# Patient Record
Sex: Male | Born: 1950 | Race: White | Hispanic: No | Marital: Married
Health system: Southern US, Community
[De-identification: ages and names within clinical notes are randomized; demographics above are authoritative.]

---

## 2008-02-28 ENCOUNTER — Ambulatory Visit: Payer: Self-pay | Admitting: Unknown Physician Specialty

## 2009-04-09 ENCOUNTER — Ambulatory Visit: Payer: Self-pay

## 2012-07-16 ENCOUNTER — Emergency Department: Payer: Self-pay | Admitting: Emergency Medicine

## 2012-07-16 LAB — URINALYSIS, COMPLETE
Leukocyte Esterase: NEGATIVE
Nitrite: NEGATIVE
Ph: 5 (ref 4.5–8.0)
Protein: NEGATIVE
Squamous Epithelial: <1
WBC UR: 2 /HPF (ref 0–5)

## 2012-07-16 LAB — CBC
HCT: 46.2 % (ref 40.0–52.0)
HGB: 16.1 g/dL (ref 13.0–18.0)
MCH: 30.8 pg (ref 26.0–34.0)
MCHC: 34.9 g/dL (ref 32.0–36.0)
MCV: 88 fL (ref 80–100)
RBC: 5.23 10*6/uL (ref 4.40–5.90)

## 2012-07-16 LAB — COMPREHENSIVE METABOLIC PANEL
Alkaline Phosphatase: 90 U/L (ref 50–136)
BUN: 25 mg/dL — ABNORMAL HIGH (ref 7–18)
Bilirubin,Total: 0.9 mg/dL (ref 0.2–1.0)
Chloride: 106 mmol/L (ref 98–107)
Co2: 26 mmol/L (ref 21–32)
Creatinine: 1.97 mg/dL — ABNORMAL HIGH (ref 0.60–1.30)
EGFR (African American): 42 — ABNORMAL LOW
Osmolality: 284 (ref 275–301)
Potassium: 3.8 mmol/L (ref 3.5–5.1)
SGPT (ALT): 45 U/L (ref 12–78)
Sodium: 139 mmol/L (ref 136–145)
Total Protein: 7.5 g/dL (ref 6.4–8.2)

## 2013-06-29 IMAGING — CT CT STONE STUDY
1 of 2 series · 15 of 32 positions shown, 19 images · non-contrast
Comparison: None

REASON FOR EXAM: left flank pain
COMMENTS:

PROCEDURE:     CT  - CT ABDOMEN /PELVIS WO (STONE)  - July 16, 2012  [DATE]
RESULT:     Indication: Flank Pain
TECHNIQUE: Multiple axial images from the lung bases to the symphysis pubis
were obtained without oral and without intravenous contrast.

[Series 2: 3mm soft tissue · axial · 0.86mm/px · z∈[-1002,-540]mm · 15 of 169 slices shown, 19 images]
[im 8/169  soft-tissue]
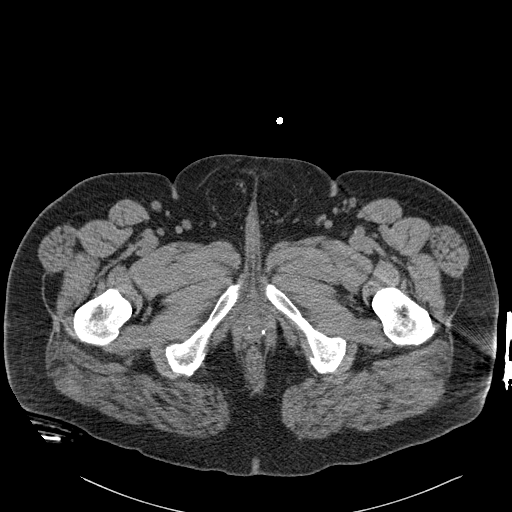
[im 8/169  bone]
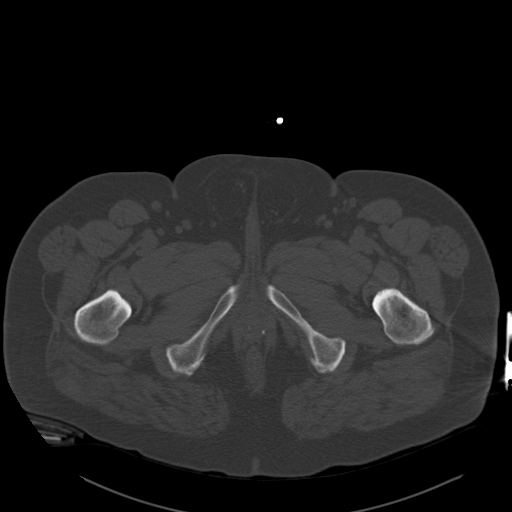
[im 22/169  soft-tissue]
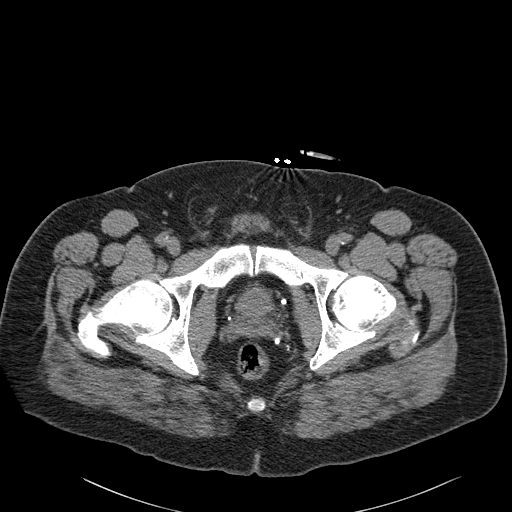
[im 36/169  soft-tissue]
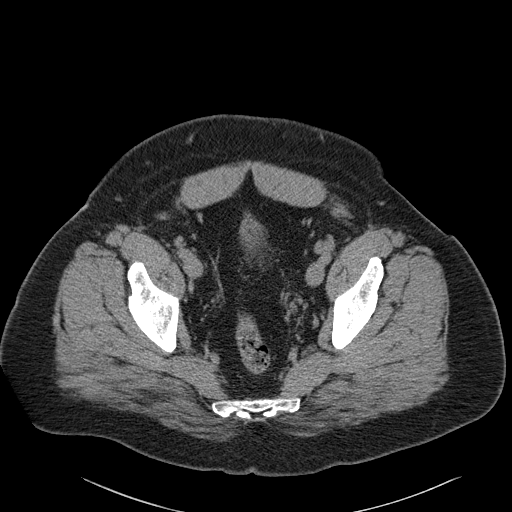
[im 50/169  soft-tissue]
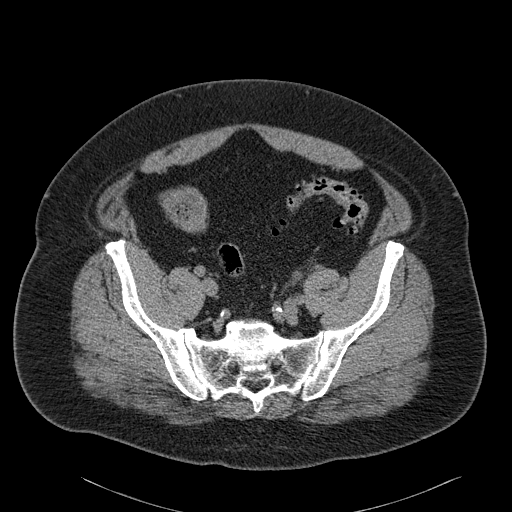
[im 57/169  soft-tissue]
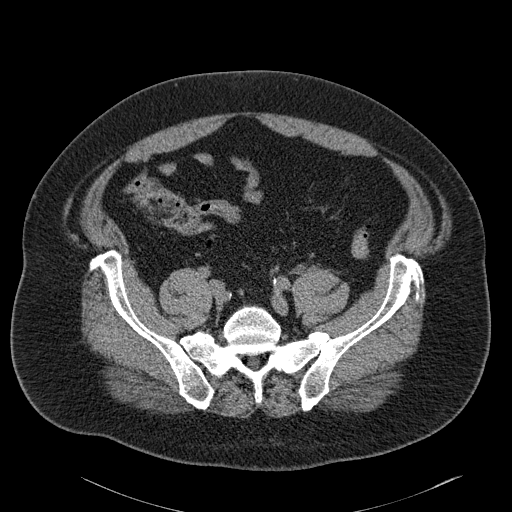
[im 71/169  soft-tissue]
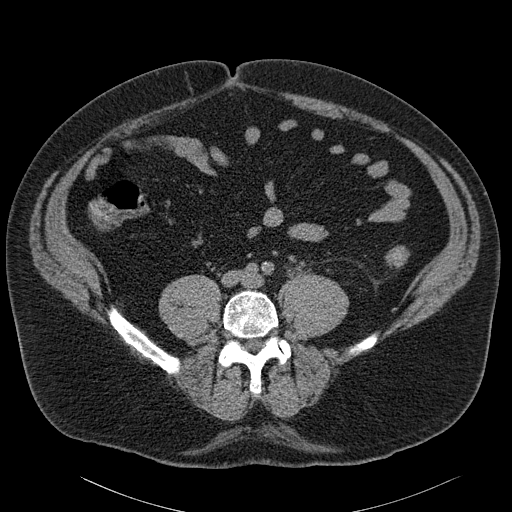
[im 85/169  soft-tissue]
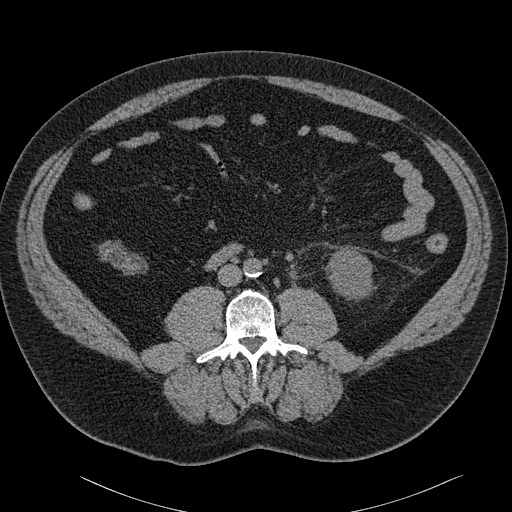
[im 99/169  soft-tissue]
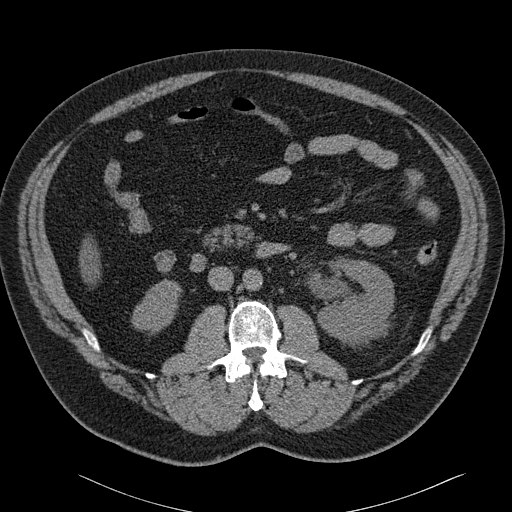
[im 113/169  soft-tissue]
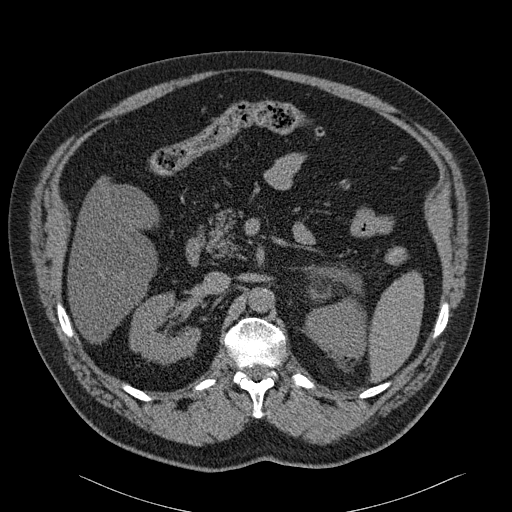
[im 113/169  bone]
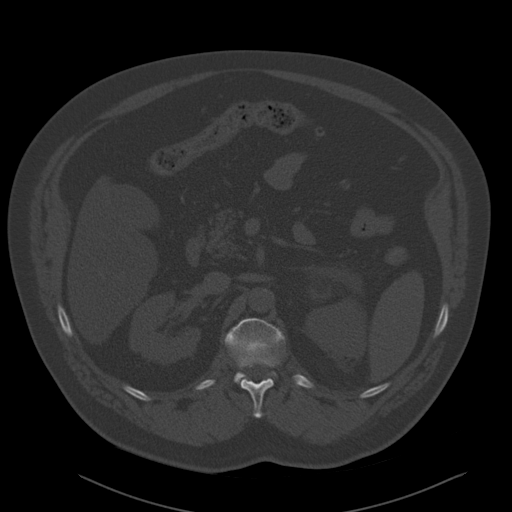
[im 120/169  soft-tissue]
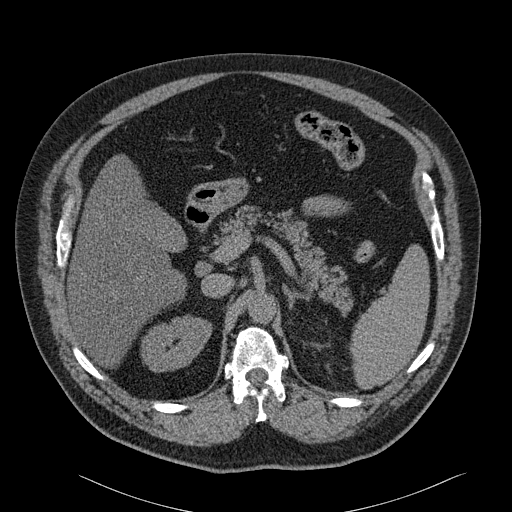
[im 134/169  soft-tissue]
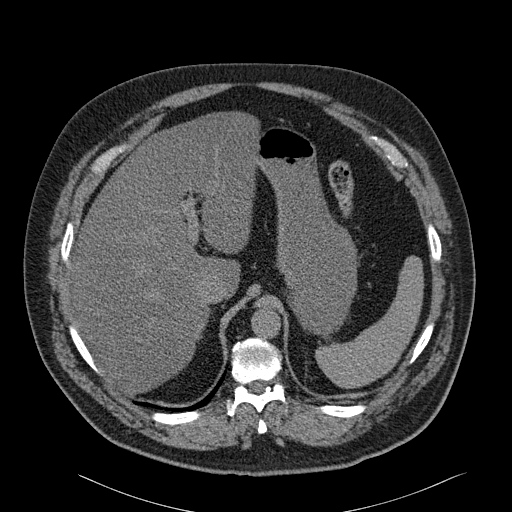
[im 141/169  lung]
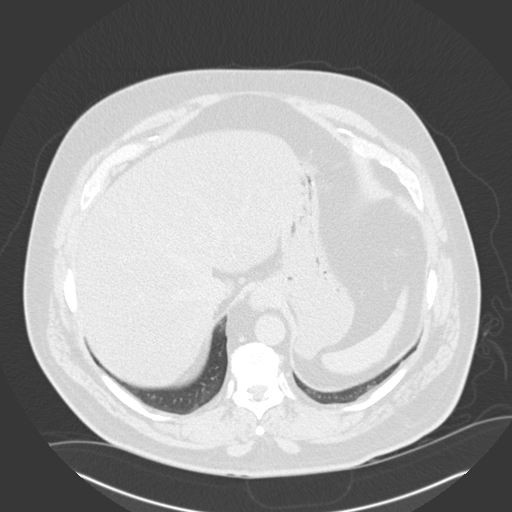
[im 148/169  soft-tissue]
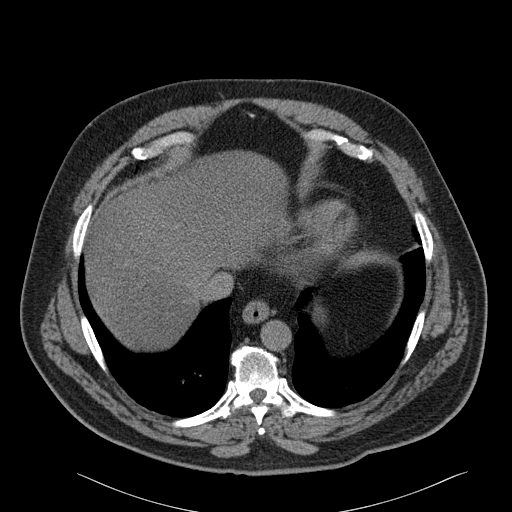
[im 148/169  lung]
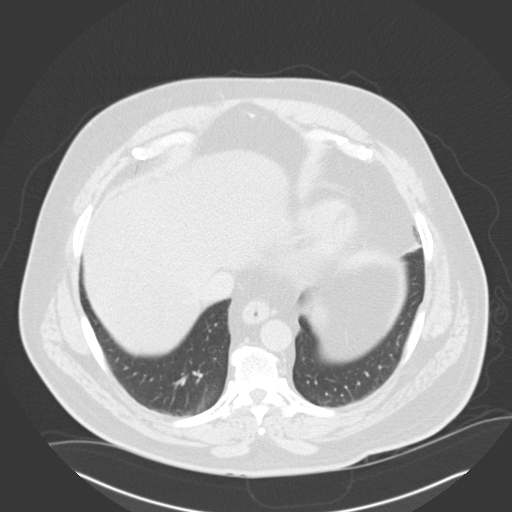
[im 155/169  lung]
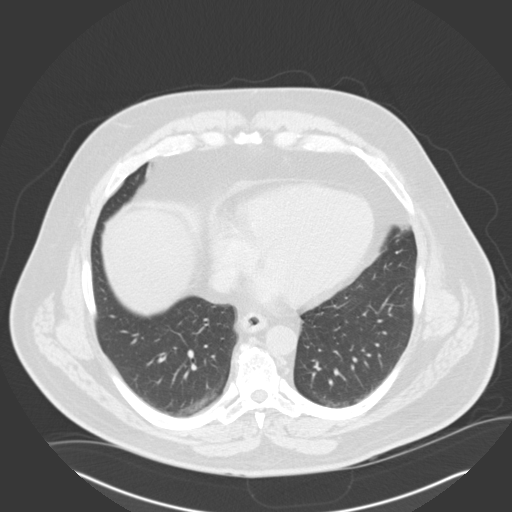
[im 162/169  soft-tissue]
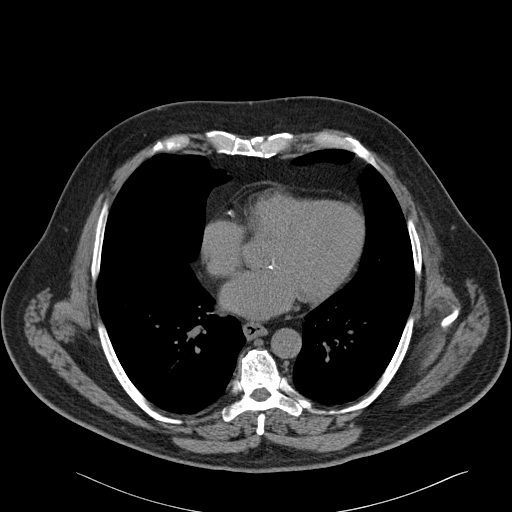
[im 162/169  lung]
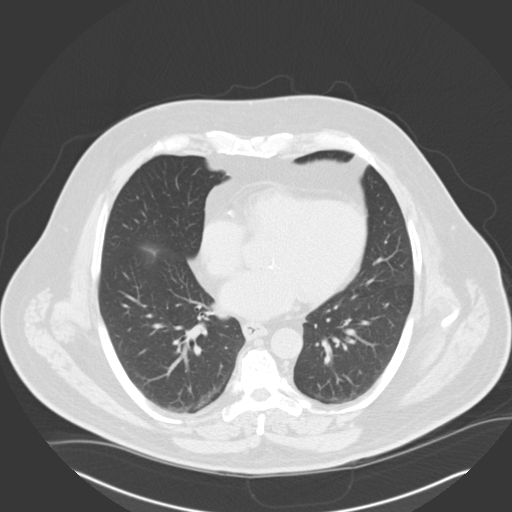

[15 of 32 positions shown; findings below may reference images not displayed]

FINDINGS: The lung bases are clear. There is no pleural or pericardial effusions.
There is coronary artery atherosclerosis.

There is a 2 mm distal left ureteral calculus resulting in mild left
hydronephrosis and mild left perinephric stranding. There is a
nonobstructing right renal calculus. The kidneys are symmetric in size
without evidence for exophytic mass. The bladder is unremarkable.

The liver is diffusely low in attenuation likely secondary to hepatic
steatosis. The gallbladder is unremarkable. The spleen demonstrates no focal
abnormality. The adrenal glands and pancreas are normal.

The unopacified stomach, duodenum, small intestine, and large intestine are
unremarkable, but evaluation is limited by lack of oral contrast. There is
diverticulosis without evidence of diverticulitis. There is a normal caliber
appendix in the right lower quadrant without periappendiceal inflammatory
changes.  There is no pneumoperitoneum, pneumatosis, or portal venous gas.
There is no abdominal or pelvic free fluid. There is no lymphadenopathy.

The abdominal aorta is normal in caliber with atherosclerosis.

The osseous structures are unremarkable.
IMPRESSION: 1. There is a 2 mm distal left ureteral calculus resulting in mild left
hydronephrosis and mild left perinephric stranding.

2. Hepatic steatosis.

[REDACTED]

## 2014-11-20 ENCOUNTER — Observation Stay: Payer: Self-pay | Admitting: Surgery

## 2014-11-20 LAB — BASIC METABOLIC PANEL
Anion Gap: 9 (ref 7–16)
BUN: 23 mg/dL — AB (ref 7–18)
Calcium, Total: 9.2 mg/dL (ref 8.5–10.1)
Chloride: 93 mmol/L — ABNORMAL LOW (ref 98–107)
Co2: 28 mmol/L (ref 21–32)
Creatinine: 1.83 mg/dL — ABNORMAL HIGH (ref 0.60–1.30)
GFR CALC AF AMER: 49 — AB
GFR CALC NON AF AMER: 40 — AB
GLUCOSE: 560 mg/dL — AB (ref 65–99)
OSMOLALITY: 290 (ref 275–301)
Potassium: 4.2 mmol/L (ref 3.5–5.1)
Sodium: 130 mmol/L — ABNORMAL LOW (ref 136–145)

## 2014-11-20 LAB — CBC WITH DIFFERENTIAL/PLATELET
BASOS ABS: 0.1 10*3/uL (ref 0.0–0.1)
Basophil %: 0.7 %
EOS ABS: 0.1 10*3/uL (ref 0.0–0.7)
Eosinophil %: 0.6 %
HCT: 44.7 % (ref 40.0–52.0)
HGB: 15.1 g/dL (ref 13.0–18.0)
Lymphocyte #: 0.8 10*3/uL — ABNORMAL LOW (ref 1.0–3.6)
Lymphocyte %: 7.1 %
MCH: 30.5 pg (ref 26.0–34.0)
MCHC: 33.8 g/dL (ref 32.0–36.0)
MCV: 90 fL (ref 80–100)
MONO ABS: 1 x10 3/mm (ref 0.2–1.0)
Monocyte %: 8.4 %
NEUTROS ABS: 9.7 10*3/uL — AB (ref 1.4–6.5)
Neutrophil %: 83.2 %
PLATELETS: 145 10*3/uL — AB (ref 150–440)
RBC: 4.96 10*6/uL (ref 4.40–5.90)
RDW: 13.2 % (ref 11.5–14.5)
WBC: 11.7 10*3/uL — AB (ref 3.8–10.6)

## 2014-11-21 LAB — URINALYSIS, COMPLETE
BACTERIA: NONE SEEN
Bilirubin,UR: NEGATIVE
Blood: NEGATIVE
Glucose,UR: >=500 mg/dL (ref 0–75)
Leukocyte Esterase: NEGATIVE
Nitrite: NEGATIVE
Ph: 5 (ref 4.5–8.0)
Protein: NEGATIVE
SPECIFIC GRAVITY: 1.026 (ref 1.003–1.030)
SQUAMOUS EPITHELIAL: NONE SEEN
WBC UR: <1 /HPF (ref 0–5)

## 2014-11-21 LAB — HEMOGLOBIN A1C: Hemoglobin A1C: 14.3 % — ABNORMAL HIGH (ref 4.2–6.3)

## 2014-11-22 LAB — CBC WITH DIFFERENTIAL/PLATELET
Basophil #: 0 10*3/uL (ref 0.0–0.1)
Basophil %: 0.3 %
EOS PCT: 0.1 %
Eosinophil #: 0 10*3/uL (ref 0.0–0.7)
HCT: 39.6 % — AB (ref 40.0–52.0)
HGB: 13 g/dL (ref 13.0–18.0)
Lymphocyte #: 1 10*3/uL (ref 1.0–3.6)
Lymphocyte %: 7.1 %
MCH: 29.8 pg (ref 26.0–34.0)
MCHC: 32.8 g/dL (ref 32.0–36.0)
MCV: 91 fL (ref 80–100)
MONOS PCT: 6.2 %
Monocyte #: 0.9 x10 3/mm (ref 0.2–1.0)
Neutrophil #: 11.8 10*3/uL — ABNORMAL HIGH (ref 1.4–6.5)
Neutrophil %: 86.3 %
PLATELETS: 145 10*3/uL — AB (ref 150–440)
RBC: 4.36 10*6/uL — ABNORMAL LOW (ref 4.40–5.90)
RDW: 13.3 % (ref 11.5–14.5)
WBC: 13.7 10*3/uL — AB (ref 3.8–10.6)

## 2014-11-22 LAB — BASIC METABOLIC PANEL
Anion Gap: 13 (ref 7–16)
BUN: 32 mg/dL — ABNORMAL HIGH (ref 7–18)
CHLORIDE: 102 mmol/L (ref 98–107)
CREATININE: 1.8 mg/dL — AB (ref 0.60–1.30)
Calcium, Total: 8.4 mg/dL — ABNORMAL LOW (ref 8.5–10.1)
Co2: 24 mmol/L (ref 21–32)
EGFR (African American): 50 — ABNORMAL LOW
EGFR (Non-African Amer.): 41 — ABNORMAL LOW
Glucose: 289 mg/dL — ABNORMAL HIGH (ref 65–99)
Osmolality: 295 (ref 275–301)
POTASSIUM: 3.8 mmol/L (ref 3.5–5.1)
Sodium: 139 mmol/L (ref 136–145)

## 2014-11-26 LAB — WOUND CULTURE

## 2015-03-07 NOTE — Consult Note (Signed)
PATIENT NAME:  Terrance Anderson, Terrance Anderson MR#:  [id number] DATE OF BIRTH:  [birth year]  DATE OF CONSULTATION:  11/21/2014  ADMITTING PHYSICIAN:  Tiney Rouge, MD   CONSULTING PHYSICIAN:  Enid Baas, MD  PRIMARY CARE PHYSICIAN: Iverson Alamin, MD  REASON FOR CONSULTATION: Medical management.   BRIEF HISTORY: Terrance Anderson is a 64 year old Caucasian male with past medical history significant for severe osteoarthritis, hypertension, diabetes mellitus, presents from home secondary to rectal pain and possible abscess going on for almost a week.  The patient said he has been in his normal state of health, doing fine. No complaints, but about a week ago he constipation for 3 days and he used softeners and after 3 days he had a large bowel movement that he felt might have hurt his rectal area, maybe a tear.  Since then, he has been having some issues with pain, saw his PCP, who thought patient was developing a perirectal abscess, sent him over to surgical clinic with Dr. Michela Pitcher to drain the abscess.  He was admitted.  His sugars on admission were noted to be in the 500s, so he did not have gap.  The patient said he was supposed to be on metformin and Actos at home, but only taking metformin and wanted to do a dietary control.  He changed over his diet, improved his physical activity as well.  He does not have a glucometer and has not been checking his sugars at home.   His A1c was ordered and found to be elevated at 14.3. His sugars are in the 300s while he was n.p.o.  A medical consult was requested.   PAST MEDICAL HISTORY:  1.  Osteoarthritis.  2.  Hypertension.  3.  Diabetes mellitus, type 2.  4.  Hyperlipidemia.   PAST SURGICAL HISTORY:  1.  Right knee surgery.  2.  Right shoulder surgery.  3.  Umbilical hernia repair.  4.  Vasectomy.   ALLERGIES: ACE INHIBITORS AND PENICILLIN.   CURRENT HOME MEDICATIONS:  1.  Coenzyme Q10 1 capsule daily.  2.  Mobic 15 mg p.o. daily.  3.  Metformin 500 mg p.o. b.i.d.   4.  Niacin 250 mg p.o. at bedtime. 5.  Benicar hydrochlorothiazide 20/12.5 mg 1 tablet p.o. daily.  6.  Viagra 100 mg 1 tablet daily p.r.n.  7.  Ultram 50 mg every 6 hours p.r.n. for pain.   SOCIAL HISTORY: Lives at home by himself. No smoking or alcohol use.   FAMILY HISTORY: Significant for congestive heart failure, diabetes mellitus, and mother with non-Hodgkin's lymphoma.   REVIEW OF SYSTEMS:  CONSTITUTIONAL: No fever, fatigue, or weakness.  EYES: No blurred vision, double vision, inflammation or glaucoma.  ENT: No tinnitus, ear pain, hearing loss, epistaxis or discharge.  RESPIRATORY: No cough, wheeze, hemoptysis, or chronic obstructive pulmonary disease.  CARDIOVASCULAR: No chest pain, orthopnea, edema, arrhythmia, palpitations, or syncope.  GASTROINTESTINAL: No nausea, vomiting, diarrhea, abdominal pain, hematemesis, or melena in the rectal area. There is a large area in the anteriorolateral perineum that has been drained and dressing is in place at this time.  ENDOCRINE: No polyuria, nocturia, thyroid problems, heat or cold intolerance.  HEMATOLOGY: No anemia, easy bruising or bleeding.  SKIN: No acne, rash or lesions.  LYMPHATICS: No cervical or inguinal lymphadenopathy.  NEUROLOGIC:  No CVA, TIA or seizures.  PSYCHOLOGIC: Anxiety, insomnia, or depression.   PHYSICAL EXAMINATION:  VITAL SIGNS: Temperature 98.3 degrees Fahrenheit, pulse 81, respirations 20, blood pressure 98/60, pulse oximetry 92% on room air.  GENERAL: Heavily built, well-nourished male, lying in bed, not in any acute distress. HEENT: Normocephalic, atraumatic. Pupils equal, round, reacting to light. Anicteric sclerae. Extraocular movements intact. Oropharynx clear without erythema, mass, or exudates.  NECK: Supple. No thyromegaly, JVD, or carotid bruits. No lymphadenopathy.  LUNGS: Moving air bilaterally. No wheeze or crackles. No use of accessory muscles for breathing.  CARDIOVASCULAR: S1, S2, regular rate  and rhythm. No murmurs, rubs, or gallops.  ABDOMEN: Soft, nontender, nondistended. No hepatosplenomegaly. Normal bowel sounds.  EXTREMITIES: No pedal edema. No clubbing or cyanosis. Normal dorsalis pedal pulses felt bilaterally.  SKIN: No acne, rash or lesions.  LYMPHATICS: No cervical lymphadenopathy.  NEUROLOGIC: Cranial nerves intact. No focal motor or sensory deficits.  PSYCHOLOGICAL: The patient is awake, alert, oriented x 3.  GENITOURINARY: There is left perirectal area, dressing in place as I and D has just been done.   LABORATORY DATA: WBC 11.7, hemoglobin 15.9, hematocrit 44.7, platelet count 145,000. Sodium 130, potassium 4.2, chloride 93, bicarbonate 28, BUN 23, creatinine 1.8, glucose 160, calcium of 9.2, HbA1c is 14.3.  Sugars have been in the 300 range today while he was n.p.o. this morning.   RECOMMENDATIONS:   A 64 year old man with uncontrolled diabetes mellitus, hypertension, admitted for perirectal abscess and medical consult requested for diabetes management.  1.  Uncontrolled type 2 diabetes mellitus.  Medication management was not enough at home and he did not have a glucometer and not checking his sugars.  A1c is up to 14.3.  Hold off on metformin as his renal function anyway is not good today.  Start him on Lantus sliding scale insulin and patient diabetic teaching and monitor sugars.  2.  Acute renal failure. Could be acute tubular necrosis.  Hold metformin and hold his Benicar that he was taking at home and do intravenous fluids and monitor.  3.  Hypertension since low normal blood pressure, hold off on medicines.  4.  Perirectal abscess status post incision and drainage, follow up cultures. Management per surgery on clindamycin and Cefoxitin.  5.  Osteoarthritis.  As needed pain medications.  CODE STATUS: FULL CODE.  TIME SPENT ON IN CONSULTATION: 50 minutes.     ____________________________ Enid Baasadhika Henning Ehle, MD rk:DT D: 11/21/2014 12:51:00 ET T: 11/21/2014  15:39:16 ET JOB#: [id number]  cc: Marina Goodellale E. Feldpausch, MD Carmie Endalph L. Ely III, MD Enid Baasadhika Assunta Pupo, MD, <Dictator>      Enid BaasADHIKA Safiyya Stokes MD ELECTRONICALLY SIGNED 11/26/2014 10:54

## 2015-03-07 NOTE — Discharge Summary (Signed)
PATIENT NAME:  Terrance Anderson, Terrance Anderson MR#:  [id number] DATE OF BIRTH:  [birth year]  DATE OF ADMISSION:  11/20/2014 DATE OF DISCHARGE:  11/23/2014  BRIEF HISTORY: Terrance Anderson is a 64 year old gentleman with large perirectal abscess. He was evaluated in the Emergency Room and referred to the office. He was seen by office physician, who recommended admission and possible incision and drainage. He was admitted on the evening of the 15th having already evening. He was taken to surgery the following morning when he underwent an incision and drainage of a large perirectal abscess. The area was packed with iodoform gauze. He was noted to have uncontrolled diabetes, which had not been treated in the past. He was evaluated by the medical service who assisted in the management of his diabetes. He also met with the lifestyle nursing staff to assist with his postoperative followup.   DISCHARGE MEDICATIONS: Include meloxicam 7.5 mg once a day, Benicar 20 mg once a day, hydrochlorothiazide 12.5 mg once a day, acetaminophen/hydrocodone 5/325 every 6 hours p.r.n., Keflex 500 mg every 8 hours, and Levemir FlexPen 20 units subcutaneously twice a day.   FINAL DISCHARGE DIAGNOSES: Perirectal abscess, diabetes mellitus.   SURGERY: Incision and drainage of perirectal abscess.    ____________________________ Rodena Goldmann III, MD rle:TT D: 11/30/2014 22:18:14 ET T: 11/30/2014 22:37:39 ET JOB#: [id number]  cc: Rodena Goldmann III, MD, <Dictator> Rodena Goldmann MD ELECTRONICALLY SIGNED 12/02/2014 22:01

## 2015-03-07 NOTE — Consult Note (Signed)
Brief Consult Note: Diagnosis: DIABETES MELLITUS, High Blood Pressure (Hypertension), perirectal abscess.   Patient was seen by consultant.   Consult note dictated.   Orders entered.   Discussed with Attending MD.   Comments: 63y/om with DIABETES MELLITUS,HTN amditted for perirectal abscess Medical consult requested for medical management  * Uncontrolled DM- known diabetic, only on metformin as outpt was also supposed to be on actos- but not taking sugars on ad in 500's, now in 300's today Hba1c ordered and 14.3 started lantus, diabetic teaching, hold metformin as renal func worse check UA  * ARF- increase IV fluids, hold metformin and ARB could be ATN  * HTN- low normal BP, hold meds  * Osteoarthritis- prn pain meds  * Perirectal abscess- status post I&D, management per surgery cultures pending, on clindamycin adn cefoxitin.  Electronic Signatures: Enid BaasKalisetti, Yianna Tersigni (MD)  (Signed 16-Jan-16 12:30)  Authored: Brief Consult Note   Last Updated: 16-Jan-16 12:30 by Enid BaasKalisetti, Yissel Habermehl (MD)

## 2015-03-07 NOTE — Op Note (Signed)
PATIENT NAME:  Terrance Anderson, Terrance Anderson MR#:  [id number] DATE OF BIRTH:  January 16, 1951  DATE OF PROCEDURE:  11/21/2014  PREOPERATIVE DIAGNOSIS: Perirectal abscess.   POSTOPERATIVE DIAGNOSIS: Perirectal abscess.  OPERATION PERFORMED: Incision and drainage.  ANESTHESIA: General.  SURGEON:  Quentin Orealph L. Ely III, MD  DESCRIPTION OF PROCEDURE: With the patient in the supine position after the induction of appropriate general anesthesia , the patient was placed in the lithotomy position, appropriately padded and positioned. The perineal area was prepped with betadine and draped with sterile towels. Digital examination revealed no significant internal abnormalities. There was Anderson large, firm mass in the median raphe, heading toward the scrotum.  He also had several large external hemorrhoids, which were not thrombosed. An incision was made in the center of the abscess and Anderson large amount of purulent material encountered. It was cultured. The pocket appeared to be fairly large and was filled with iodoform gauze. Sterile dressings were applied. The patient was returned to the recovery room, having tolerated the procedure well. Sponge, instrument, and needle counts were correct x 2 in the operating room.   ____________________________ Quentin Orealph L. Ely III, MD rle:mw D: 11/21/2014 09:02:21 ET T: 11/21/2014 15:35:03 ET JOB#: [id number]  cc: Carmie Endalph L. Ely III, MD, <Dictator> [facility] [place] Quentin OreALPH L ELY MD ELECTRONICALLY SIGNED 11/21/2014 18:42

## 2015-05-07 DEATH — deceased
